# Patient Record
Sex: Male | Born: 1970 | Hispanic: No | Marital: Single | Smoking: Never smoker
Health system: Southern US, Community
[De-identification: ages and names within clinical notes are randomized; demographics above are authoritative.]

---

## 2006-04-12 ENCOUNTER — Ambulatory Visit (HOSPITAL_BASED_OUTPATIENT_CLINIC_OR_DEPARTMENT_OTHER): Admission: RE | Admit: 2006-04-12 | Discharge: 2006-04-12 | Payer: Self-pay | Admitting: Specialist

## 2017-12-30 ENCOUNTER — Ambulatory Visit: Payer: BLUE CROSS/BLUE SHIELD | Admitting: Pain Medicine

## 2018-01-05 NOTE — Patient Instructions (Addendum)
____________________________________________________________________________________________  Post-Procedure Discharge Instructions  Instructions:  Apply ice: Fill a plastic sandwich bag with crushed ice. Cover it with a small towel and apply to injection site. Apply for 15 minutes then remove x 15 minutes. Repeat sequence on day of procedure, until you go to bed. The purpose is to minimize swelling and discomfort after procedure.  Apply heat: Apply heat to procedure site starting the day following the procedure. The purpose is to treat any soreness and discomfort from the procedure.  Food intake: Start with clear liquids (like water) and advance to regular food, as tolerated.   Physical activities: Keep activities to a minimum for the first 8 hours after the procedure.   Driving: If you have received any sedation, you are not allowed to drive for 24 hours after your procedure.  Blood thinner: Restart your blood thinner 6 hours after your procedure. (Only for those taking blood thinners)  Insulin: As soon as you can eat, you may resume your normal dosing schedule. (Only for those taking insulin)  Infection prevention: Keep procedure site clean and dry.  Post-procedure Pain Diary: Extremely important that this be done correctly and accurately. Recorded information will be used to determine the next step in treatment.  Pain evaluated is that of treated area only. Do not include pain from an untreated area.  Complete every hour, on the hour, for the initial 8 hours. Set an alarm to help you do this part accurately.  Do not go to sleep and have it completed later. It will not be accurate.  Follow-up appointment: Keep your follow-up appointment after the procedure. Usually 2 weeks for most procedures. (6 weeks in the case of radiofrequency.) Bring you pain diary.   Expect:  From numbing medicine (AKA: Local Anesthetics): Numbness or decrease in pain.  Onset: Full effect within 15  minutes of injected.  Duration: It will depend on the type of local anesthetic used. On the average, 1 to 8 hours.   From steroids: Decrease in swelling or inflammation. Once inflammation is improved, relief of the pain will follow.  Onset of benefits: Depends on the amount of swelling present. The more swelling, the longer it will take for the benefits to be seen. In some cases, up to 10 days.  Duration: Steroids will stay in the system x 2 weeks. Duration of benefits will depend on multiple posibilities including persistent irritating factors.  From procedure: Some discomfort is to be expected once the numbing medicine wears off. This should be minimal if ice and heat are applied as instructed.  Call if:  You experience numbness and weakness that gets worse with time, as opposed to wearing off.  New onset bowel or bladder incontinence. (This applies to Spinal procedures only)  Emergency Numbers:  Durning business hours (Monday - Thursday, 8:00 AM - 4:00 PM) (Friday, 9:00 AM - 12:00 Noon): [phone]  After hours: [phone] ____________________________________________________________________________________________   Epidural Steroid Injection Patient Information  Description: The epidural space surrounds the nerves as they exit the spinal cord.  In some patients, the nerves can be compressed and inflamed by a bulging disc or a tight spinal canal (spinal stenosis).  By injecting steroids into the epidural space, we can bring irritated nerves into direct contact with a potentially helpful medication.  These steroids act directly on the irritated nerves and can reduce swelling and inflammation which often leads to decreased pain.  Epidural steroids may be injected anywhere along the spine and from the neck to the  low back depending upon the location of your pain.   After numbing the skin with local anesthetic (like Novocaine), a small needle is passed into the epidural space  slowly.  You may experience a sensation of pressure while this is being done.  The entire block usually last less than 10 minutes.  Conditions which may be treated by epidural steroids:   Low back and leg pain  Neck and arm pain  Spinal stenosis  Post-laminectomy syndrome  Herpes zoster (shingles) pain  Pain from compression fractures  Preparation for the injection:  1. Do not eat any solid food or dairy products within 8 hours of your appointment.  2. You may drink clear liquids up to 3 hours before appointment.  Clear liquids include water, black coffee, juice or soda.  No milk or cream please. 3. You may take your regular medication, including pain medications, with a sip of water before your appointment  Diabetics should hold regular insulin (if taken separately) and take 1/2 normal NPH dos the morning of the procedure.  Carry some sugar containing items with you to your appointment. 4. A driver must accompany you and be prepared to drive you home after your procedure.  5. Bring all your current medications with your. 6. An IV may be inserted and sedation may be given at the discretion of the physician.   7. A blood pressure cuff, EKG and other monitors will often be applied during the procedure.  Some patients may need to have extra oxygen administered for a short period. 8. You will be asked to provide medical information, including your allergies, prior to the procedure.  We must know immediately if you are taking blood thinners (like Coumadin/Warfarin)  Or if you are allergic to IV iodine contrast (dye). We must know if you could possible be pregnant.  Possible side-effects:  Bleeding from needle site  Infection (rare, may require surgery)  Nerve injury (rare)  Numbness & tingling (temporary)  Difficulty urinating (rare, temporary)  Spinal headache ( a headache worse with upright posture)  Light -headedness (temporary)  Pain at injection site (several  days)  Decreased blood pressure (temporary)  Weakness in arm/leg (temporary)  Pressure sensation in back/neck (temporary)  Call if you experience:  Fever/chills associated with headache or increased back/neck pain.  Headache worsened by an upright position.  New onset weakness or numbness of an extremity below the injection site  Hives or difficulty breathing (go to the emergency room)  Inflammation or drainage at the infection site  Severe back/neck pain  Any new symptoms which are concerning to you  Please note:  Although the local anesthetic injected can often make your back or neck feel good for several hours after the injection, the pain will likely return.  It takes 3-7 days for steroids to work in the epidural space.  You may not notice any pain relief for at least that one week.  If effective, we will often do a series of three injections spaced 3-6 weeks apart to maximally decrease your pain.  After the initial series, we generally will wait several months before considering a repeat injection of the same type.  If you have any questions, please call [phone] [facility]  General Discharge Instructions :  If you need to reach your doctor call: Monday-Friday 8:00 am - 4:00 pm at [phone] or toll free [phone].  After clinic hours [phone] to have operator reach doctor.  Bring all   of your medication bottles to all your appointments in the pain clinic.  To cancel or reschedule your appointment with Pain Management please remember to call 24 hours in advance to avoid a fee.  Refer to the educational materials which you have been given on: General Risks, I had my Procedure. Discharge Instructions, Post Sedation.  Post Procedure Instructions:  The drugs you were given will stay in your system until tomorrow, so for the next 24 hours you should not drive, make any legal decisions  or drink any alcoholic beverages.  You may eat anything you prefer, but it is better to start with liquids then soups and crackers, and gradually work up to solid foods.  Please notify your doctor immediately if you have any unusual bleeding, trouble breathing or pain that is not related to your normal pain.  Depending on the type of procedure that was done, some parts of your body may feel week and/or numb.  This usually clears up by tonight or the next day.  Walk with the use of an assistive device or accompanied by an adult for the 24 hours.  You may use ice on the affected area for the first 24 hours.  Put ice in a Ziploc bag and cover with a towel and place against area 15 minutes on 15 minutes off.  You may switch to heat after 24 hours. 

## 2018-01-05 NOTE — Progress Notes (Addendum)
Patient's Name: Patrick Gaines  MRN: [id number]  Referring Provider: Ronita Hipps, MD  DOB: 1971/03/18  PCP: No primary care provider on file.  DOS: 01/06/2018  Note by: Oswaldo Done, MD  Service setting: Ambulatory outpatient  Specialty: Interventional Pain Management  Location: [facility] (AMB) Pain Management Facility    Patient type: New patient ("FAST-TRACK" Evaluation)   Warning: This referral option does not include the extensive pharmacological evaluation required for [place] to take over the patient's medication management. The "Fast-Track" system is designed to bypass the new patient referral waiting list, as well as the normal patient evaluation process, in order to provide a patient in distress with a timely pain management intervention. Because the system was not designed to unfairly get a patient into our pain practice ahead of those already waiting, certain restrictions apply. By requesting a "Fast-Track" consult, the referring physician has opted to continue managing the patient's medications in order to get interventional urgent care.  Primary Reason for Visit: Interventional Pain Management Treatment. CC: Back Pain (lower)  Procedure:          Anesthesia, Analgesia, Anxiolysis:  Type: Diagnostic Inter-Laminar Epidural Steroid Injection #1  Region: Lumbar Level: L3-4 Level. Laterality: Right-Sided Paramedial  Type: Local Anesthesia Indication(s): Analgesia         Route: Infiltration ([place]/IM) IV Access: Declined Sedation: Declined  Local Anesthetic: Lidocaine 1-2%   Indications: 1. DDD (degenerative disc disease), lumbar   2. Chronic low back pain (Primary Area of Pain) (Bilateral) (R>L)   3. Chronic hip pain (Right)   4. Lumbar facet syndrome   5. Spondylosis without myelopathy or radiculopathy, lumbosacral region   6. Chronic pain syndrome    Pain Score: Pre-procedure: 3 /10 Post-procedure: 3 /10  HPI  Patrick Gaines is a 47 y.o. year old, male patient, who comes today  for a  "Fast-Track" new patient evaluation, as requested by Ronita Hipps, MD. The patient has been made aware that this type of referral option is reserved for the Interventional Pain Management portion of our practice and completely excludes the option of medication management. His primarily concern today is the Back Pain (lower)  Pain Assessment: Location: Lower Back Radiating: Denies Onset: More than a month ago Duration: Chronic pain Quality: Stabbing, Nagging, Constant Severity: 3 /10 (subjective, self-reported pain score)  Note: Reported level is inconsistent with clinical observations. Clinically the patient looks like a 2/10 A 2/10 is viewed as "Mild to Moderate" and described as noticeable and distracting. Impossible to hide from other people. More frequent flare-ups. Still possible to adapt and function close to normal. It can be very annoying and may have occasional stronger flare-ups. With discipline, patients may get used to it and adapt. Information on the proper use of the pain scale provided to the patient today. When using our objective Pain Scale, levels between 6 and 10/10 are said to belong in an emergency room, as it progressively worsens from a 6/10, described as severely limiting, requiring emergency care not usually available at an outpatient pain management facility. At a 6/10 level, communication becomes difficult and requires great effort. Assistance to reach the emergency department may be required. Facial flushing and profuse sweating along with potentially dangerous increases in heart rate and blood pressure will be evident. Effect on ADL: limits daily activities Timing: Constant Modifying factors: motrin, lay down and rest, repositioning BP: 97/60  HR: 89  Onset and Duration: Gradual and Present longer than 3 months Cause of pain: Unknown Severity: Getting better, NAS-11 at  its worse: 9/10, NAS-11 at its best: 2/10, NAS-11 now: 8/10 and NAS-11 on the average:  5/10 Timing: Afternoon and after working Aggravating Factors: Lifiting, Squatting and Stooping  Alleviating Factors: Cold packs, Lying down, Medications and Resting Associated Problems: Fatigue, Weakness and Pain that does not allow patient to sleep Quality of Pain: Nagging, Sharp, Shooting and Throbbing Previous Examinations or Tests: CT scan and MRI scan Previous Treatments: Steroid treatments by mouth  The patient comes into the clinics today, referred to us for a According to the patient he has been suffering from low back pain for a number years. Just recently, he experienced a flareup of his low back pain that involve some pain going down into the right hip area. He went to the emergency room at the [facility]. Those records are not available to us at this time, but he indicates that soon after that his leg pain went away. He comes in here referred to us as a "fast track" for a lumbar epidural steroid injection under fluoroscopic guidance. He indicates having had some x-rays done, as well as CT and MRI of the lumbar spine, at the [facility].  The patient's primary pain is described to be in the lower back, bilaterally, starting in the midline and then moving laterally. The pain is worse on the right when compared to the left. He denies any surgery, interventional therapies, or any recent physical therapy.  The patient's second area of pain is that of the left knee where he indicates having sustained some trauma many years ago. He has had intra-articular knee injection with local anesthetic and steroids by the orthopedic surgeons. He also indicates having had an arthroscopy. At this point he reports that this is not his primary concern. However, he indicated that he would love to come back in the future to see if we can help him with this knee pain.  The next area of pain is described to be that of the right hip were he is currently not having any pain, but he indicates that on that  day in question, The pain felt to be excruciating. Again, he denies any surgeries, interventional therapies, or physical therapy. He also denies any x-rays in that area.  Meds   Current Outpatient Medications:  .  ibuprofen (ADVIL,MOTRIN) 800 MG tablet, Take 800 mg by mouth every 8 (eight) hours as needed., Disp: , Rfl:   Imaging Review   Complexity Note: No results found under the [facility] electronic medical record.                         ROS  Cardiovascular: No reported cardiovascular signs or symptoms such as High blood pressure, coronary artery disease, abnormal heart rate or rhythm, heart attack, blood thinner therapy or heart weakness and/or failure Pulmonary or Respiratory: No reported pulmonary signs or symptoms such as wheezing and difficulty taking a deep full breath (Asthma), difficulty blowing air out (Emphysema), coughing up mucus (Bronchitis), persistent dry cough, or temporary stoppage of breathing during sleep Neurological: No reported neurological signs or symptoms such as seizures, abnormal skin sensations, urinary and/or fecal incontinence, being born with an abnormal open spine and/or a tethered spinal cord Review of Past Neurological Studies: No results found for this or any previous visit. Psychological-Psychiatric: No reported psychological or psychiatric signs or symptoms such as difficulty sleeping, anxiety, depression, delusions or hallucinations (schizophrenial), mood swings (bipolar disorders) or suicidal ideations or attempts Gastrointestinal: Reflux or heatburn Genitourinary:  No reported renal or genitourinary signs or symptoms such as difficulty voiding or producing urine, peeing blood, non-functioning kidney, kidney stones, difficulty emptying the bladder, difficulty controlling the flow of urine, or chronic kidney disease Hematological: No reported hematological signs or symptoms such as prolonged bleeding, low or poor functioning platelets, bruising or  bleeding easily, hereditary bleeding problems, low energy levels due to low hemoglobin or being anemic Endocrine: No reported endocrine signs or symptoms such as high or low blood sugar, rapid heart rate due to high thyroid levels, obesity or weight gain due to slow thyroid or thyroid disease Rheumatologic: No reported rheumatological signs and symptoms such as fatigue, joint pain, tenderness, swelling, redness, heat, stiffness, decreased range of motion, with or without associated rash Musculoskeletal: Negative for myasthenia gravis, muscular dystrophy, multiple sclerosis or malignant hyperthermia Work History: Working full time  Allergies  Patrick Gaines is allergic to codeine.  Laboratory Chemistry   Note: No results found under the [facility] electronic medical record  [facility]  Drug: Patrick Gaines  has no drug history on file. Alcohol:  has no alcohol history on file. Tobacco:  reports that he has never smoked. He has never used smokeless tobacco. Medical:  has no past medical history on file. Family: family history includes Arthritis in his father and mother; Diabetes in his father and mother; Hypertension in his mother; Obesity in his father and mother; Stroke in his mother.  History reviewed. No pertinent surgical history. Active Ambulatory Problems    Diagnosis Date Noted  . Chronic pain syndrome 01/06/2018  . DDD (degenerative disc disease), lumbar 01/06/2018  . Chronic low back pain (Primary Area of Pain) (Bilateral) (R>L) 01/06/2018  . Spondylosis without myelopathy or radiculopathy, lumbosacral region 01/06/2018  . Lumbar facet syndrome (Bilateral) (R>L) 01/06/2018  . Chronic hip pain (Right) 01/06/2018  . Grade 1 Lumbar Retrolisthesis (4 mm) of L5 over S1 01/06/2018  . Lumbar facet hypertrophy (Bilateral) 01/06/2018  . 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 Annular tear of lumbar disc (L4-5 and L5-S1) 01/06/2018  . Protrusion of intervertebral  disc of lumbosacral region (Right: L5-S1) (Left: L4-5) (Central: L3-4) 01/06/2018  . 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 Diverticulosis of colon 01/06/2018  . 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 Fluctuating blood pressure 01/06/2018   Resolved Ambulatory Problems    Diagnosis Date Noted  . No Resolved Ambulatory Problems   No Additional Past Medical History   Constitutional Exam  General appearance: Well nourished, well developed, and well hydrated. In no apparent acute distress Vitals:   01/06/18 0842 01/06/18 1001 01/06/18 1006 01/06/18 1012  BP: 111/75 122/69 97/60   Pulse: 89     Resp:  18 15 16   Temp: 97.9 F (36.6 C)     SpO2: 97% 98% 100% 100%  Weight: 230 lb (104.3 kg)     Height: 6\' 1"  (1.854 m)      BMI Assessment: Estimated body mass index is 30.34 kg/m as calculated from the following:   Height as of this encounter: 6\' 1"  (1.854 m).   Weight as of this encounter: 230 lb (104.3 kg).  BMI interpretation table: BMI level Category Range association with higher incidence of chronic pain  <18 kg/m2 Underweight   18.5-24.9 kg/m2 Ideal body weight   25-29.9 kg/m2 Overweight Increased incidence by 20%  30-34.9 kg/m2 Obese (Class I) Increased incidence by 68%  35-39.9 kg/m2 Severe obesity (Class II) Increased incidence by 136%  >40 kg/m2  Extreme obesity (Class III) Increased incidence by 254%   Patient's current BMI Ideal Body weight  Body mass index is 30.34 kg/m. Ideal body weight: 79.9 kg (176 lb 2.4 oz) Adjusted ideal body weight: 89.7 kg (197 lb 11 oz)   BMI Readings from Last 4 Encounters:  01/06/18 30.34 kg/m   Wt Readings from Last 4 Encounters:  01/06/18 230 lb (104.3 kg)  Psych/Mental status: Alert, oriented x 3 (person, place, & time)       Eyes: PERLA Respiratory: No evidence of acute respiratory distress  Lumbar Spine Area Exam  Skin & Axial Inspection: No masses, redness, or swelling Alignment: Symmetrical Functional ROM:  Decreased ROM       Stability: No instability detected Muscle Tone/Strength: Functionally intact. No obvious neuro-muscular anomalies detected. Sensory (Neurological): Unimpaired Palpation: No palpable anomalies       Provocative Tests: Hyperextension/rotation test: deferred today       Lumbar quadrant test (Kemp's test): deferred today       Lateral bending test: deferred today       Patrick's Maneuver: deferred today                   FABER test: deferred today                   S-I anterior distraction/compression test: deferred today         S-I lateral compression test: deferred today         S-I Thigh-thrust test: deferred today         S-I Gaenslen's test: deferred today          Gait & Posture Assessment  Ambulation: Unassisted Gait: Relatively normal for age and body habitus Posture: WNL   Lower Extremity Exam    Side: Right lower extremity  Side: Left lower extremity  Stability: No instability observed          Stability: No instability observed          Skin & Extremity Inspection: Skin color, temperature, and hair growth are WNL. No peripheral edema or cyanosis. No masses, redness, swelling, asymmetry, or associated skin lesions. No contractures.  Skin & Extremity Inspection: Skin color, temperature, and hair growth are WNL. No peripheral edema or cyanosis. No masses, redness, swelling, asymmetry, or associated skin lesions. No contractures.  Functional ROM: Unrestricted ROM                  Functional ROM: Unrestricted ROM                  Muscle Tone/Strength: Able to Toe-walk & Heel-walk without problems  Muscle Tone/Strength: Able to Toe-walk & Heel-walk without problems  Sensory (Neurological): Unimpaired  Sensory (Neurological): Unimpaired  Palpation: No palpable anomalies  Palpation: No palpable anomalies   Pre-op Assessment:  Patrick Gaines is a 47 y.o. (year old), male patient, seen today for interventional treatment. He  has no past surgical history on file. Mr.  Gaines has a current medication list which includes the following prescription(s): ibuprofen. His primarily concern today is the Back Pain (lower)  Initial Vital Signs:  Pulse/HCG Rate: 89ECG Heart Rate: 84 Temp: 97.9 F (36.6 C) Resp: 18 BP: 111/75 SpO2: 97 %  BMI: Estimated body mass index is 30.34 kg/m as calculated from the following:   Height as of this encounter: 6\' 1"  (1.854 m).   Weight as of this encounter: 230 lb (104.3 kg).  Risk Assessment: Allergies: Reviewed. He is allergic  to codeine.  Allergy Precautions: None required Coagulopathies: Reviewed. None identified.  Blood-thinner therapy: None at this time Active Infection(s): Reviewed. None identified. Mr. Baumgarten is afebrile  Site Confirmation: Patrick Gaines was asked to confirm the procedure and laterality before marking the site Procedure checklist: Completed Consent: Before the procedure and under the influence of no sedative(s), amnesic(s), or anxiolytics, the patient was informed of the treatment options, risks and possible complications. To fulfill our ethical and legal obligations, as recommended by the American Medical Association's Code of Ethics, I have informed the patient of my clinical impression; the nature and purpose of the treatment or procedure; the risks, benefits, and possible complications of the intervention; the alternatives, including doing nothing; the risk(s) and benefit(s) of the alternative treatment(s) or procedure(s); and the risk(s) and benefit(s) of doing nothing. The patient was provided information about the general risks and possible complications associated with the procedure. These may include, but are not limited to: failure to achieve desired goals, infection, bleeding, organ or nerve damage, allergic reactions, paralysis, and death. In addition, the patient was informed of those risks and complications associated to Spine-related procedures, such as failure to decrease pain; infection  (i.e.: Meningitis, epidural or intraspinal abscess); bleeding (i.e.: epidural hematoma, subarachnoid hemorrhage, or any other type of intraspinal or peri-dural bleeding); organ or nerve damage (i.e.: Any type of peripheral nerve, nerve root, or spinal cord injury) with subsequent damage to sensory, motor, and/or autonomic systems, resulting in permanent pain, numbness, and/or weakness of one or several areas of the body; allergic reactions; (i.e.: anaphylactic reaction); and/or death. Furthermore, the patient was informed of those risks and complications associated with the medications. These include, but are not limited to: allergic reactions (i.e.: anaphylactic or anaphylactoid reaction(s)); adrenal axis suppression; blood sugar elevation that in diabetics may result in ketoacidosis or comma; water retention that in patients with history of congestive heart failure may result in shortness of breath, pulmonary edema, and decompensation with resultant heart failure; weight gain; swelling or edema; medication-induced neural toxicity; particulate matter embolism and blood vessel occlusion with resultant organ, and/or nervous system infarction; and/or aseptic necrosis of one or more joints. Finally, the patient was informed that Medicine is not an exact science; therefore, there is also the possibility of unforeseen or unpredictable risks and/or possible complications that may result in a catastrophic outcome. The patient indicated having understood very clearly. We have given the patient no guarantees and we have made no promises. Enough time was given to the patient to ask questions, all of which were answered to the patient's satisfaction. Patrick Gaines has indicated that he wanted to continue with the procedure. Attestation: I, the ordering provider, attest that I have discussed with the patient the benefits, risks, side-effects, alternatives, likelihood of achieving goals, and potential problems during recovery  for the procedure that I have provided informed consent. Date  Time: 01/06/2018  8:45 AM  Pre-Procedure Preparation:  Monitoring: As per clinic protocol. Respiration, ETCO2, SpO2, BP, heart rate and rhythm monitor placed and checked for adequate function Safety Precautions: Patient was assessed for positional comfort and pressure points before starting the procedure. Time-out: I initiated and conducted the "Time-out" before starting the procedure, as per protocol. The patient was asked to participate by confirming the accuracy of the "Time Out" information. Verification of the correct person, site, and procedure were performed and confirmed by me, the nursing staff, and the patient. "Time-out" conducted as per Joint Commission's Universal Protocol (UP.01.01.01). Time: 1003  Description of Procedure:  Position: Prone with head of the table was raised to facilitate breathing. Target Area: The interlaminar space, initially targeting the lower laminar border of the superior vertebral body. Approach: Paramedial approach. Area Prepped: Entire Posterior Lumbar Region Prepping solution: ChloraPrep (2% chlorhexidine gluconate and 70% isopropyl alcohol) Safety Precautions: Aspiration looking for blood return was conducted prior to all injections. At no point did we inject any substances, as a needle was being advanced. No attempts were made at seeking any paresthesias. Safe injection practices and needle disposal techniques used. Medications properly checked for expiration dates. SDV (single dose vial) medications used. Description of the Procedure: Protocol guidelines were followed. The procedure needle was introduced through the skin, ipsilateral to the reported pain, and advanced to the target area. Bone was contacted and the needle walked caudad, until the lamina was cleared. The epidural space was identified using "loss-of-resistance technique" with 2-3 ml of PF-NaCl (0.9% NSS), in a 5cc LOR glass  syringe. Vitals:   01/06/18 0842 01/06/18 1001 01/06/18 1006 01/06/18 1012  BP: 111/75 122/69 97/60   Pulse: 89     Resp:  18 15 16   Temp: 97.9 F (36.6 C)     SpO2: 97% 98% 100% 100%  Weight: 230 lb (104.3 kg)     Height: 6\' 1"  (1.854 m)       Start Time: 1003 hrs. End Time: 1009 hrs. Materials:  Needle(s) Type: Epidural needle Gauge: 17G Length: 3.5-in Medication(s): Please see orders for medications and dosing details.  Imaging Guidance (Spinal):          Type of Imaging Technique: Fluoroscopy Guidance (Spinal) Indication(s): Assistance in needle guidance and placement for procedures requiring needle placement in or near specific anatomical locations not easily accessible without such assistance. Exposure Time: Please see nurses notes. Contrast: Before injecting any contrast, we confirmed that the patient did not have an allergy to iodine, shellfish, or radiological contrast. Once satisfactory needle placement was completed at the desired level, radiological contrast was injected. Contrast injected under live fluoroscopy. No contrast complications. See chart for type and volume of contrast used. Fluoroscopic Guidance: I was personally present during the use of fluoroscopy. "Tunnel Vision Technique" used to obtain the best possible view of the target area. Parallax error corrected before commencing the procedure. "Direction-depth-direction" technique used to introduce the needle under continuous pulsed fluoroscopy. Once target was reached, antero-posterior, oblique, and lateral fluoroscopic projection used confirm needle placement in all planes. Images permanently stored in EMR.            Interpretation: I personally interpreted the imaging intraoperatively. Adequate needle placement confirmed in multiple planes. Appropriate spread of contrast into desired area was observed. No evidence of afferent or efferent intravascular uptake. No intrathecal or subarachnoid spread observed.  Permanent images saved into the patient's record.  Antibiotic Prophylaxis:   Anti-infectives (From admission, onward)   None     Indication(s): None identified  Post-operative Assessment:  Post-procedure Vital Signs:  Pulse/HCG Rate: 8973 Temp: 97.9 F (36.6 C) Resp: 16 BP: 97/60 SpO2: 100 %  EBL: None  Complications: No immediate post-treatment complications observed by team, or reported by patient.  Note: The patient tolerated the entire procedure well. A repeat set of vitals were taken after the procedure and the patient was kept under observation following institutional policy, for this type of procedure. Post-procedural neurological assessment was performed, showing return to baseline, prior to discharge. The patient was provided with post-procedure discharge instructions, including a section on how to identify potential problems.  Should any problems arise concerning this procedure, the patient was given instructions to immediately contact us, at any time, without hesitation. In any case, we plan to contact the patient by telephone for a follow-up status report regarding this interventional procedure.  Comments:  No additional relevant information.  Plan of Care   Imaging Orders     DG C-Arm 1-60 Min-No Report  Procedure Orders     Lumbar Epidural Injection  Medications ordered for procedure: Meds ordered this encounter  Medications  . iopamidol (ISOVUE-M) 41 % intrathecal injection 10 mL    Must be Myelogram-compatible. If not available, you may substitute with a water-soluble, non-ionic, hypoallergenic, myelogram-compatible radiological contrast medium.  Marland Kitchen lidocaine (XYLOCAINE) 2 % (with pres) injection 400 mg  . sodium chloride flush (NS) 0.9 % injection 2 mL  . ropivacaine (PF) 2 mg/mL (0.2%) (NAROPIN) injection 2 mL  . triamcinolone acetonide (KENALOG-40) injection 40 mg   Medications administered: We administered iopamidol, lidocaine, sodium chloride flush,  ropivacaine (PF) 2 mg/mL (0.2%), and triamcinolone acetonide.  See the medical record for exact dosing, route, and time of administration.  New Prescriptions   No medications on file   Disposition: Discharge home  Discharge Date & Time: 01/06/2018; 1020 hrs.   Physician-requested Follow-up: Return for post-procedure eval (2 wks).  Future Appointments  Date Time Provider Department Center  01/26/2018 10:15 AM Delano Metz, MD [facility] None   Primary Care Physician: No primary care provider on file. Location: [facility] Note by: Oswaldo Done, MD Date: 01/06/2018; Time: 11:14 AM  Disclaimer:  Medicine is not an Visual merchandiser. The only guarantee in medicine is that nothing is guaranteed. It is important to note that the decision to proceed with this intervention was based on the information collected from the patient. The Data and conclusions were drawn from the patient's questionnaire, the interview, and the physical examination. Because the information was provided in large part by the patient, it cannot be guaranteed that it has not been purposely or unconsciously manipulated. Every effort has been made to obtain as much relevant data as possible for this evaluation. It is important to note that the conclusions that lead to this procedure are derived in large part from the available data. Always take into account that the treatment will also be dependent on availability of resources and existing treatment guidelines, considered by other Pain Management Practitioners as being common knowledge and practice, at the time of the intervention. For Medico-Legal purposes, it is also important to point out that variation in procedural techniques and pharmacological choices are the acceptable norm. The indications, contraindications, technique, and results of the above procedure should only be interpreted and judged by a Board-Certified Interventional Pain  Specialist with extensive familiarity and expertise in the same exact procedure and technique.

## 2018-01-06 ENCOUNTER — Ambulatory Visit
Admission: RE | Admit: 2018-01-06 | Discharge: 2018-01-06 | Disposition: A | Discharge status: Home / Self Care | Payer: BLUE CROSS/BLUE SHIELD | Source: Ambulatory Visit | Attending: Pain Medicine | Admitting: Pain Medicine

## 2018-01-06 ENCOUNTER — Encounter: Payer: Self-pay | Admitting: Pain Medicine

## 2018-01-06 ENCOUNTER — Ambulatory Visit (HOSPITAL_BASED_OUTPATIENT_CLINIC_OR_DEPARTMENT_OTHER): Payer: BLUE CROSS/BLUE SHIELD | Admitting: Pain Medicine

## 2018-01-06 ENCOUNTER — Other Ambulatory Visit: Payer: Self-pay

## 2018-01-06 VITALS — BP 97/60 | HR 89 | Temp 97.9°F | Resp 16 | Ht 73.0 in | Wt 230.0 lb

## 2018-01-06 DIAGNOSIS — M545 Low back pain, unspecified: Secondary | ICD-10-CM | POA: Insufficient documentation

## 2018-01-06 DIAGNOSIS — M48061 Spinal stenosis, lumbar region without neurogenic claudication: Secondary | ICD-10-CM | POA: Diagnosis not present

## 2018-01-06 DIAGNOSIS — G8929 Other chronic pain: Secondary | ICD-10-CM

## 2018-01-06 DIAGNOSIS — M5127 Other intervertebral disc displacement, lumbosacral region: Secondary | ICD-10-CM | POA: Insufficient documentation

## 2018-01-06 DIAGNOSIS — M5126 Other intervertebral disc displacement, lumbar region: Secondary | ICD-10-CM | POA: Diagnosis not present

## 2018-01-06 DIAGNOSIS — G894 Chronic pain syndrome: Secondary | ICD-10-CM

## 2018-01-06 DIAGNOSIS — Z791 Long term (current) use of non-steroidal anti-inflammatories (NSAID): Secondary | ICD-10-CM | POA: Insufficient documentation

## 2018-01-06 DIAGNOSIS — Z8249 Family history of ischemic heart disease and other diseases of the circulatory system: Secondary | ICD-10-CM | POA: Diagnosis not present

## 2018-01-06 DIAGNOSIS — E278 Other specified disorders of adrenal gland: Secondary | ICD-10-CM | POA: Insufficient documentation

## 2018-01-06 DIAGNOSIS — I998 Other disorder of circulatory system: Secondary | ICD-10-CM | POA: Diagnosis not present

## 2018-01-06 DIAGNOSIS — M8938 Hypertrophy of bone, other site: Secondary | ICD-10-CM | POA: Insufficient documentation

## 2018-01-06 DIAGNOSIS — Z833 Family history of diabetes mellitus: Secondary | ICD-10-CM | POA: Insufficient documentation

## 2018-01-06 DIAGNOSIS — M5136 Other intervertebral disc degeneration, lumbar region: Secondary | ICD-10-CM | POA: Insufficient documentation

## 2018-01-06 DIAGNOSIS — M47897 Other spondylosis, lumbosacral region: Secondary | ICD-10-CM | POA: Insufficient documentation

## 2018-01-06 DIAGNOSIS — K5730 Diverticulosis of large intestine without perforation or abscess without bleeding: Secondary | ICD-10-CM | POA: Insufficient documentation

## 2018-01-06 DIAGNOSIS — M25551 Pain in right hip: Secondary | ICD-10-CM | POA: Insufficient documentation

## 2018-01-06 DIAGNOSIS — Z885 Allergy status to narcotic agent status: Secondary | ICD-10-CM | POA: Diagnosis not present

## 2018-01-06 DIAGNOSIS — M4310 Spondylolisthesis, site unspecified: Secondary | ICD-10-CM | POA: Insufficient documentation

## 2018-01-06 DIAGNOSIS — Z823 Family history of stroke: Secondary | ICD-10-CM | POA: Insufficient documentation

## 2018-01-06 DIAGNOSIS — G96191 Perineural cyst: Secondary | ICD-10-CM | POA: Insufficient documentation

## 2018-01-06 DIAGNOSIS — G548 Other nerve root and plexus disorders: Secondary | ICD-10-CM

## 2018-01-06 DIAGNOSIS — M47817 Spondylosis without myelopathy or radiculopathy, lumbosacral region: Secondary | ICD-10-CM | POA: Insufficient documentation

## 2018-01-06 DIAGNOSIS — M47816 Spondylosis without myelopathy or radiculopathy, lumbar region: Secondary | ICD-10-CM

## 2018-01-06 DIAGNOSIS — M4317 Spondylolisthesis, lumbosacral region: Secondary | ICD-10-CM | POA: Insufficient documentation

## 2018-01-06 MED ORDER — IOPAMIDOL (ISOVUE-M 200) INJECTION 41%
10.0000 mL | Freq: Once | INTRAMUSCULAR | Status: AC
  Administered 2018-01-06: 10 mL via EPIDURAL
  Filled 2018-01-06: qty 10

## 2018-01-06 MED ORDER — LIDOCAINE HCL 2 % IJ SOLN
20.0000 mL | Freq: Once | INTRAMUSCULAR | Status: AC
  Administered 2018-01-06: 400 mg
  Filled 2018-01-06: qty 40

## 2018-01-06 MED ORDER — SODIUM CHLORIDE 0.9% FLUSH
2.0000 mL | Freq: Once | INTRAVENOUS | Status: AC
  Administered 2018-01-06: 10 mL

## 2018-01-06 MED ORDER — ROPIVACAINE HCL 2 MG/ML IJ SOLN
2.0000 mL | Freq: Once | INTRAMUSCULAR | Status: AC
  Administered 2018-01-06: 2 mL via EPIDURAL
  Filled 2018-01-06: qty 10

## 2018-01-06 MED ORDER — TRIAMCINOLONE ACETONIDE 40 MG/ML IJ SUSP
40.0000 mg | Freq: Once | INTRAMUSCULAR | Status: AC
  Administered 2018-01-06: 40 mg
  Filled 2018-01-06: qty 1

## 2018-01-07 ENCOUNTER — Telehealth: Payer: Self-pay

## 2018-01-07 NOTE — Telephone Encounter (Signed)
Post procedure phone call.  Patient states he is doing good.  

## 2018-01-11 ENCOUNTER — Other Ambulatory Visit: Payer: Self-pay

## 2018-01-25 NOTE — Progress Notes (Signed)
Patient's Name: Patrick Gaines  MRN: [id number]  Referring Provider: No ref. provider found  DOB: 1970/08/30  PCP: Patient, No Pcp Per  DOS: 01/26/2018  Note by: Gaspar Cola, MD  Service setting: Ambulatory outpatient  Specialty: Interventional Pain Management  Location: [facility] (AMB) Pain Management Facility    Patient type: Established "Fast-Track"   Primary Reason(s) for Visit: Encounter for post-procedure evaluation of chronic illness with mild to moderate exacerbation CC: Back Pain (low)  HPI  Patrick Gaines is a 47 y.o. year old, male patient, who comes today for a post-procedure evaluation. 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 His primarily concern today is the Back Pain (low)  Pain Assessment: Location: Lower Back Radiating: denies Onset: More than a month ago Duration: Chronic pain Quality: Aching, Stabbing Severity: 2 /10 (subjective, self-reported pain score)  Note: Reported level is compatible with observation.                         When using our objective Pain Scale, levels between 6 and 10/10 are said to belong in an emergency room, as it progressively worsens from a 6/10, described as severely limiting, requiring emergency care not  usually available at an outpatient pain management facility. At a 6/10 level, communication becomes difficult and requires great effort. Assistance to reach the emergency department may be required. Facial flushing and profuse sweating along with potentially dangerous increases in heart rate and blood pressure will be evident. Timing: Intermittent Modifying factors: rest and movement, injection, medications and ice BP: 119/87  HR: 75  Patrick Gaines comes in today for post-procedure evaluation after the treatment done on 01/07/2018.  Further details on both, my assessment(s), as well as the proposed treatment plan, please see below.  Post-Procedure Assessment  01/06/2018 Procedure: Diagnostic/therapeutic right-sided L3-4 lumbar epidural steroid injection #1 under fluoroscopic guidance, no sedation ("Fast-Track") Pre-procedure pain score:  3/10 Post-procedure pain score: 3/10 No initial benefit, possible due to rapid discharge after no sedation procedure, without enough time to allow full onset of block. Influential Factors: BMI: 30.34 kg/m Intra-procedural challenges: None observed.         Assessment challenges: None detected.              Reported side-effects: None.        Post-procedural adverse reactions or complications: None reported         Sedation: No sedation used. When no sedatives are used, the analgesic levels obtained are directly associated to the effectiveness of the local anesthetics. However, when sedation is provided, the level of analgesia obtained during the initial 1 hour following the intervention, is believed to be the result of a combination of factors. These factors may include, but are not limited to: 1. The effectiveness of the local anesthetics used. 2. The effects of the analgesic(s) and/or anxiolytic(s) used. 3. The degree of discomfort experienced by  the patient at the time of the procedure. 4. The patients ability and reliability in recalling and recording the  events. 5. The presence and influence of possible secondary gains and/or psychosocial factors. Reported result: Relief experienced during the 1st hour after the procedure: 100 % (Ultra-Short Term Relief)            Interpretative annotation: Clinically appropriate result. No IV Analgesic or Anxiolytic given, therefore benefits are completely due to Local Anesthetic effects.          Effects of local anesthetic: The analgesic effects attained during this period are directly associated to the localized infiltration of local anesthetics and therefore cary significant diagnostic value as to the etiological location, or anatomical origin, of the pain. Expected duration of relief is directly dependent on the pharmacodynamics of the local anesthetic used. Long-acting (4-6 hours) anesthetics used.  Reported result: Relief during the next 4 to 6 hour after the procedure: 85 % (Short-Term Relief)            Interpretative annotation: Clinically appropriate result. Analgesia during this period is likely to be Local Anesthetic-related.          Long-term benefit: Defined as the period of time past the expected duration of local anesthetics (1 hour for short-acting and 4-6 hours for long-acting). With the possible exception of prolonged sympathetic blockade from the local anesthetics, benefits during this period are typically attributed to, or associated with, other factors such as analgesic sensory neuropraxia, antiinflammatory effects, or beneficial biochemical changes provided by agents other than the local anesthetics.  Reported result: Extended relief following procedure: 50 %(morning still hurt, eases up during day , then pain slightly increases at end of day) (Long-Term Relief)            Interpretative annotation: Clinically possible results. Good relief. No permanent benefit expected. Inflammation plays a part in the etiology to the pain.          Current benefits: Defined as reported results that  persistent at this point in time.   Analgesia: 50 %            Function: Somewhat improved ROM: Somewhat improved Interpretative annotation: Recurrence of symptoms. No permanent benefit expected. Effective diagnostic intervention.          Interpretation: Results would suggest a successful diagnostic intervention.                  Plan:  Please see "Plan of Care" for details.                Laboratory Chemistry  Inflammation Markers (CRP: Acute Phase) (ESR: Chronic Phase) No results found.  Renal Markers No results found.  Hepatic Markers No results found.  Neuropathy Markers No results found.  Hematology Parameters No results found.  CV Markers No results found.  Note: No results found under the [facility] record  Recent Imaging Results   Results for orders placed in visit on 01/06/18  DG C-Arm 1-60 Min-No Report   Narrative Fluoroscopy was utilized by the requesting physician.  No radiographic  interpretation.    Interpretation Report: Fluoroscopy was used during the procedure to assist with needle guidance. The images were interpreted intraoperatively by the requesting physician.  Meds   Current Outpatient Medications:  .  ibuprofen (ADVIL,MOTRIN) 800 MG tablet, Take 800 mg by mouth every 8 (eight) hours as needed., Disp: , Rfl:   ROS  Constitutional: Denies any fever or chills Gastrointestinal: No reported hemesis,  hematochezia, vomiting, or acute GI distress Musculoskeletal: Denies any acute onset joint swelling, redness, loss of ROM, or weakness Neurological: No reported episodes of acute onset apraxia, aphasia, dysarthria, agnosia, amnesia, paralysis, loss of coordination, or loss of consciousness  Allergies  Patrick Gaines is allergic to codeine.  PFSH  Drug: Patrick Gaines  has no drug history on file. Alcohol:  has no alcohol history on file. Tobacco:  reports that he has never smoked. He has never used smokeless tobacco. Medical:   has no past medical history on file. Surgical: Patrick Gaines  has no past surgical history on file. Family: family history includes Arthritis in his father and mother; Diabetes in his father and mother; Hypertension in his mother; Obesity in his father and mother; Stroke in his mother.  Constitutional Exam  General appearance: Well nourished, well developed, and well hydrated. In no apparent acute distress Vitals:   01/26/18 1009  BP: 119/87  Pulse: 75  Resp: 16  Temp: 98 F (36.7 C)  SpO2: 97%  Weight: 230 lb (104.3 kg)  Height: 6' 1"  (1.854 m)   BMI Assessment: Estimated body mass index is 30.34 kg/m as calculated from the following:   Height as of this encounter: 6' 1"  (1.854 m).   Weight as of this encounter: 230 lb (104.3 kg).  BMI interpretation table: BMI level Category Range association with higher incidence of chronic pain  <18 kg/m2 Underweight   18.5-24.9 kg/m2 Ideal body weight   25-29.9 kg/m2 Overweight Increased incidence by 20%  30-34.9 kg/m2 Obese (Class I) Increased incidence by 68%  35-39.9 kg/m2 Severe obesity (Class II) Increased incidence by 136%  >40 kg/m2 Extreme obesity (Class III) Increased incidence by 254%   Patient's current BMI Ideal Body weight  Body mass index is 30.34 kg/m. Ideal body weight: 79.9 kg (176 lb 2.4 oz) Adjusted ideal body weight: 89.7 kg (197 lb 11 oz)   BMI Readings from Last 4 Encounters:  01/26/18 30.34 kg/m  01/06/18 30.34 kg/m   Wt Readings from Last 4 Encounters:  01/26/18 230 lb (104.3 kg)  01/06/18 230 lb (104.3 kg)  Psych/Mental status: Alert, oriented x 3 (person, place, & time)       Eyes: PERLA Respiratory: No evidence of acute respiratory distress  Cervical Spine Area Exam  Skin & Axial Inspection: No masses, redness, edema, swelling, or associated skin lesions Alignment: Symmetrical Functional ROM: Unrestricted ROM      Stability: No instability detected Muscle Tone/Strength: Functionally intact. No  obvious neuro-muscular anomalies detected. Sensory (Neurological): Unimpaired Palpation: No palpable anomalies              Upper Extremity (UE) Exam    Side: Right upper extremity  Side: Left upper extremity  Skin & Extremity Inspection: Skin color, temperature, and hair growth are WNL. No peripheral edema or cyanosis. No masses, redness, swelling, asymmetry, or associated skin lesions. No contractures.  Skin & Extremity Inspection: Skin color, temperature, and hair growth are WNL. No peripheral edema or cyanosis. No masses, redness, swelling, asymmetry, or associated skin lesions. No contractures.  Functional ROM: Unrestricted ROM          Functional ROM: Unrestricted ROM          Muscle Tone/Strength: Functionally intact. No obvious neuro-muscular anomalies detected.  Muscle Tone/Strength: Functionally intact. No obvious neuro-muscular anomalies detected.  Sensory (Neurological): Unimpaired          Sensory (Neurological): Unimpaired          Palpation: No palpable  anomalies              Palpation: No palpable anomalies              Provocative Test(s):  Phalen's test: deferred Tinel's test: deferred Apley's scratch test (touch opposite shoulder):  Action 1 (Across chest): deferred Action 2 (Overhead): deferred Action 3 (LB reach): deferred   Provocative Test(s):  Phalen's test: deferred Tinel's test: deferred Apley's scratch test (touch opposite shoulder):  Action 1 (Across chest): deferred Action 2 (Overhead): deferred Action 3 (LB reach): deferred    Thoracic Spine Area Exam  Skin & Axial Inspection: No masses, redness, or swelling Alignment: Symmetrical Functional ROM: Unrestricted ROM Stability: No instability detected Muscle Tone/Strength: Functionally intact. No obvious neuro-muscular anomalies detected. Sensory (Neurological): Unimpaired Muscle strength & Tone: No palpable anomalies  Lumbar Spine Area Exam  Skin & Axial Inspection: No masses, redness, or  swelling Alignment: Symmetrical Functional ROM: Unrestricted ROM       Stability: No instability detected Muscle Tone/Strength: Functionally intact. No obvious neuro-muscular anomalies detected. Sensory (Neurological): Unimpaired Palpation: No palpable anomalies       Provocative Tests: Hyperextension/rotation test: deferred today       Lumbar quadrant test (Kemp's test): deferred today       Lateral bending test: deferred today       Patrick's Maneuver: deferred today                   FABER test: deferred today                   S-I anterior distraction/compression test: deferred today         S-I lateral compression test: deferred today         S-I Thigh-thrust test: deferred today         S-I Gaenslen's test: deferred today          Gait & Posture Assessment  Ambulation: Unassisted Gait: Relatively normal for age and body habitus Posture: WNL   Lower Extremity Exam    Side: Right lower extremity  Side: Left lower extremity  Stability: No instability observed          Stability: No instability observed          Skin & Extremity Inspection: Skin color, temperature, and hair growth are WNL. No peripheral edema or cyanosis. No masses, redness, swelling, asymmetry, or associated skin lesions. No contractures.  Skin & Extremity Inspection: Skin color, temperature, and hair growth are WNL. No peripheral edema or cyanosis. No masses, redness, swelling, asymmetry, or associated skin lesions. No contractures.  Functional ROM: Unrestricted ROM                  Functional ROM: Unrestricted ROM                  Muscle Tone/Strength: Functionally intact. No obvious neuro-muscular anomalies detected.  Muscle Tone/Strength: Functionally intact. No obvious neuro-muscular anomalies detected.  Sensory (Neurological): Unimpaired  Sensory (Neurological): Unimpaired  Palpation: No palpable anomalies  Palpation: No palpable anomalies   Assessment  Primary Diagnosis & Pertinent Problem List: The  primary encounter diagnosis was Chronic low back pain (Primary Area of Pain) (Bilateral) (R>L). 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  Status Diagnosis  Controlled Controlled Controlled 1. Chronic low back pain (Primary Area of Pain) (Bilateral) (R>L)   2. Grade 1 Lumbar Retrolisthesis (4 mm) of L5 over S1   3. Lumbar facet hypertrophy (Bilateral)   4. Lumbar facet syndrome (Bilateral) (R>L)   5. Spondylosis without myelopathy or radiculopathy, lumbosacral region   6. Pharmacologic therapy   7. Problems influencing health status   8. Disorder of skeletal system   9. Fluctuating blood pressure   10. 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 Plan of Care  Pharmacotherapy (Medications Ordered): No orders of the defined types were placed in this encounter.  Medications administered today: Patrick Gaines had no medications administered during this visit.   Procedure Orders     LUMBAR FACET(MEDIAL BRANCH NERVE BLOCK) MBNB  Lab Orders     Comp. Metabolic Panel (12)     Magnesium     Vitamin B12     Sedimentation rate     25-Hydroxyvitamin D Lcms D2+D3     C-reactive protein     Compliance Drug Analysis, Ur     Catecholamine+VMA, 24-Hr Urine     VMA, random urine  Imaging Orders     DG Lumbar Spine Complete W/Bend Referral Orders  No referral(s) requested today   Interventional management options: Planned, scheduled, and/or pending:   Diagnostic bilateral lumbar facet block #1 under fluoroscopic guidance and IV sedation   Considering:   Diagnostic  bilateral lumbar facet block #1  Possible bilateral lumbar facet RFA  Diagnostic/therapeutic right-sided L3-4 LESI #2  Diagnostic bilateral L4-5 transforaminal ESI #1  Diagnostic bilateral L5-S1 transforaminal ESI #1  Diagnostic right-sided L3-4 transforaminal ESI #1  Diagnostic left-sided L4 selective nerve root block vs transforaminal ESI #1    Palliative PRN treatment(s):   None at this time   Provider-requested follow-up: Return for Procedure (w/ sedation): (B) L-FCT BLK #1.  No future appointments. Primary Care Physician: Patient, No Pcp Per Location: [facility] Note by: Gaspar Cola, MD Date: 01/26/2018; Time: 11:37 AM

## 2018-01-26 ENCOUNTER — Other Ambulatory Visit: Payer: Self-pay

## 2018-01-26 ENCOUNTER — Ambulatory Visit: Payer: BLUE CROSS/BLUE SHIELD | Admitting: Pain Medicine

## 2018-01-26 ENCOUNTER — Ambulatory Visit
Admission: RE | Admit: 2018-01-26 | Discharge: 2018-01-26 | Disposition: A | Discharge status: Home / Self Care | Payer: BLUE CROSS/BLUE SHIELD | Source: Ambulatory Visit | Attending: Pain Medicine | Admitting: Pain Medicine

## 2018-01-26 ENCOUNTER — Encounter: Payer: Self-pay | Admitting: Pain Medicine

## 2018-01-26 VITALS — BP 119/87 | HR 75 | Temp 98.0°F | Resp 16 | Ht 73.0 in | Wt 230.0 lb

## 2018-01-26 DIAGNOSIS — G8929 Other chronic pain: Secondary | ICD-10-CM

## 2018-01-26 DIAGNOSIS — I998 Other disorder of circulatory system: Secondary | ICD-10-CM

## 2018-01-26 DIAGNOSIS — M4310 Spondylolisthesis, site unspecified: Secondary | ICD-10-CM

## 2018-01-26 DIAGNOSIS — M47817 Spondylosis without myelopathy or radiculopathy, lumbosacral region: Secondary | ICD-10-CM

## 2018-01-26 DIAGNOSIS — M545 Low back pain, unspecified: Secondary | ICD-10-CM

## 2018-01-26 DIAGNOSIS — M47816 Spondylosis without myelopathy or radiculopathy, lumbar region: Secondary | ICD-10-CM | POA: Diagnosis not present

## 2018-01-26 DIAGNOSIS — E279 Disorder of adrenal gland, unspecified: Secondary | ICD-10-CM

## 2018-01-26 DIAGNOSIS — M899 Disorder of bone, unspecified: Secondary | ICD-10-CM | POA: Insufficient documentation

## 2018-01-26 DIAGNOSIS — E278 Other specified disorders of adrenal gland: Secondary | ICD-10-CM

## 2018-01-26 DIAGNOSIS — Z789 Other specified health status: Secondary | ICD-10-CM | POA: Insufficient documentation

## 2018-01-26 DIAGNOSIS — Z79899 Other long term (current) drug therapy: Secondary | ICD-10-CM

## 2018-01-26 NOTE — Patient Instructions (Signed)
____________________________________________________________________________________________  Preparing for Procedure with Sedation  Instructions: . Oral Intake: Do not eat or drink anything for at least 8 hours prior to your procedure. . Transportation: Public transportation is not allowed. Bring an adult driver. The driver must be physically present in our waiting room before any procedure can be started. . Physical Assistance: Bring an adult physically capable of assisting you, in the event you need help. This adult should keep you company at home for at least 6 hours after the procedure. . Blood Pressure Medicine: Take your blood pressure medicine with a sip of water the morning of the procedure. . Blood thinners: Notify our staff if you are taking any blood thinners. Depending on which one you take, there will be specific instructions on how and when to stop it. . Diabetics on insulin: Notify the staff so that you can be scheduled 1st case in the morning. If your diabetes requires high dose insulin, take only  of your normal insulin dose the morning of the procedure and notify the staff that you have done so. . Preventing infections: Shower with an antibacterial soap the morning of your procedure. . Build-up your immune system: Take 1000 mg of Vitamin C with every meal (3 times a day) the day prior to your procedure. . Antibiotics: Inform the staff if you have a condition or reason that requires you to take antibiotics before dental procedures. . Pregnancy: If you are pregnant, call and cancel the procedure. . Sickness: If you have a cold, fever, or any active infections, call and cancel the procedure. . Arrival: You must be in the facility at least 30 minutes prior to your scheduled procedure. . Children: Do not bring children with you. . Dress appropriately: Bring dark clothing that you would not mind if they get stained. . Valuables: Do not bring any jewelry or valuables.  Procedure  appointments are reserved for interventional treatments only. . No Prescription Refills. . No medication changes will be discussed during procedure appointments. . No disability issues will be discussed.  Reasons to call and reschedule or cancel your procedure: (Following these recommendations will minimize the risk of a serious complication.) . Surgeries: Avoid having procedures within 2 weeks of any surgery. (Avoid for 2 weeks before or after any surgery). . Flu Shots: Avoid having procedures within 2 weeks of a flu shots or . (Avoid for 2 weeks before or after immunizations). . Barium: Avoid having a procedure within 7-10 days after having had a radiological study involving the use of radiological contrast. (Myelograms, Barium swallow or enema study). . Heart attacks: Avoid any elective procedures or surgeries for the initial 6 months after a "Myocardial Infarction" (Heart Attack). . Blood thinners: It is imperative that you stop these medications before procedures. Let us know if you if you take any blood thinner.  . Infection: Avoid procedures during or within two weeks of an infection (including chest colds or gastrointestinal problems). Symptoms associated with infections include: Localized redness, fever, chills, night sweats or profuse sweating, burning sensation when voiding, cough, congestion, stuffiness, runny nose, sore throat, diarrhea, nausea, vomiting, cold or Flu symptoms, recent or current infections. It is specially important if the infection is over the area that we intend to treat. . Heart and lung problems: Symptoms that may suggest an active cardiopulmonary problem include: cough, chest pain, breathing difficulties or shortness of breath, dizziness, ankle swelling, uncontrolled high or unusually low blood pressure, and/or palpitations. If you are experiencing any of these symptoms, cancel   your procedure and contact your primary care physician for an evaluation.  Remember:   Regular Business hours are:  Monday to Thursday 8:00 AM to 4:00 PM  Provider's Schedule: Kjersti Dittmer, MD:  Procedure days: Tuesday and Thursday 7:30 AM to 4:00 PM  Bilal Lateef, MD:  Procedure days: Monday and Wednesday 7:30 AM to 4:00 PM ____________________________________________________________________________________________    

## 2018-01-26 NOTE — Progress Notes (Signed)
Safety precautions to be maintained throughout the outpatient stay will include: orient to surroundings, keep bed in low position, maintain call bell within reach at all times, provide assistance with transfer out of bed and ambulation.  

## 2018-01-31 LAB — 25-HYDROXYVITAMIN D LCMS D2+D3: 25-HYDROXY, VITAMIN D: 27 ng/mL — AB

## 2018-01-31 LAB — COMP. METABOLIC PANEL (12)
A/G RATIO: 1.7 (ref 1.2–2.2)
AST: 18 IU/L (ref 0–40)
Albumin: 4.6 g/dL (ref 3.5–5.5)
Alkaline Phosphatase: 56 IU/L (ref 39–117)
BUN/Creatinine Ratio: 11 (ref 9–20)
BUN: 13 mg/dL (ref 6–24)
Bilirubin Total: 0.4 mg/dL (ref 0.0–1.2)
CHLORIDE: 101 mmol/L (ref 96–106)
CREATININE: 1.14 mg/dL (ref 0.76–1.27)
Calcium: 10.1 mg/dL (ref 8.7–10.2)
GFR calc Af Amer: 90 mL/min/{1.73_m2} (ref >59)
GFR calc non Af Amer: 78 mL/min/{1.73_m2} (ref >59)
Globulin, Total: 2.7 g/dL (ref 1.5–4.5)
Glucose: 84 mg/dL (ref 65–99)
Potassium: 4.9 mmol/L (ref 3.5–5.2)
Sodium: 143 mmol/L (ref 134–144)
Total Protein: 7.3 g/dL (ref 6.0–8.5)

## 2018-01-31 LAB — VMA, RANDOM URINE
CREATININE, RANDOM U: 96.4 mg/dL
VMA, RANDOM URINE: 2.5 mg/L
VMA/CRT, RANDOM U: 2.6 mg/g{creat} (ref 0.0–6.0)

## 2018-01-31 LAB — VITAMIN B12: Vitamin B-12: 564 pg/mL (ref 232–1245)

## 2018-01-31 LAB — MAGNESIUM: MAGNESIUM: 2 mg/dL (ref 1.6–2.3)

## 2018-01-31 LAB — SEDIMENTATION RATE: SED RATE: 4 mm/h (ref 0–15)

## 2018-01-31 LAB — C-REACTIVE PROTEIN: CRP: 1 mg/L (ref 0–10)

## 2018-01-31 LAB — 25-HYDROXY VITAMIN D LCMS D2+D3: 25-Hydroxy, Vitamin D-3: 27 ng/mL

## 2018-01-31 LAB — COMPLIANCE DRUG ANALYSIS, UR

## 2018-02-10 LAB — CATECHOLAMINE+VMA, 24-HR URINE
DOPAMINE 24H UR: 237 ug/(24.h) (ref 0–510)
Dopamine, Rand Ur: 113 ug/L
EPINEPHRINE 24H UR: 2 ug/(24.h) (ref 0–20)
EPINEPHRINE RAND UR: 1 ug/L
NOREPINEPH RAND UR: 17 ug/L
Norepinephrine, 24H Ur: 36 ug/24 hr (ref 0–135)
VMA 24H UR ADULT: 3.8 mg/(24.h) (ref 0.0–7.5)
VMA, URINE: 1.8 mg/L

## 2019-07-20 IMAGING — CR DG LUMBAR SPINE COMPLETE W/ BEND
1 series · 7 of 7 positions shown · non-contrast
Comparison: MRI 10/28/2017.  CT 10/19/2017.

CLINICAL DATA: Chronic back pain.

EXAM:
LUMBAR SPINE - COMPLETE WITH BENDING VIEWS

[Series 1: dg lumbar spine complete w/bend 6+v · 0.14mm/px · 7 of 7 slices shown]
[im 1/7]
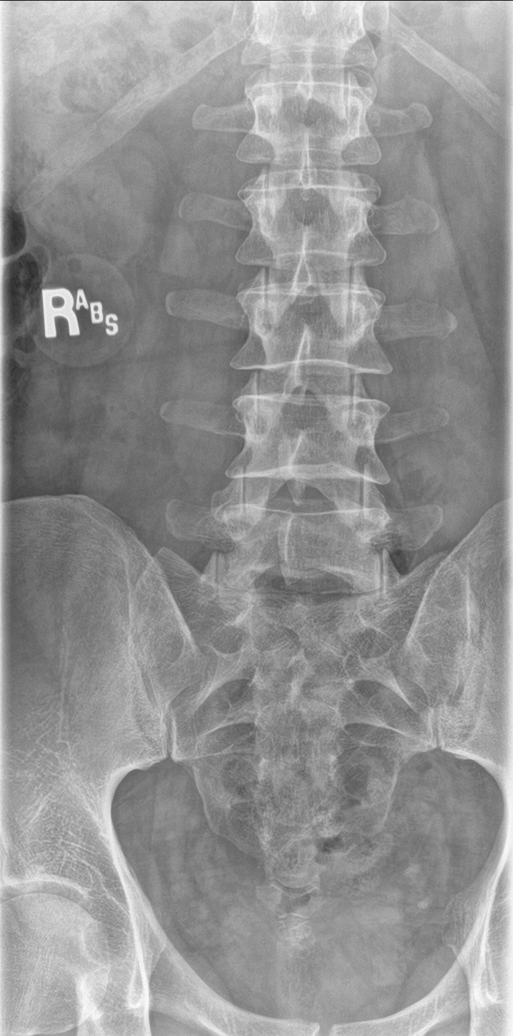
[im 2/7]
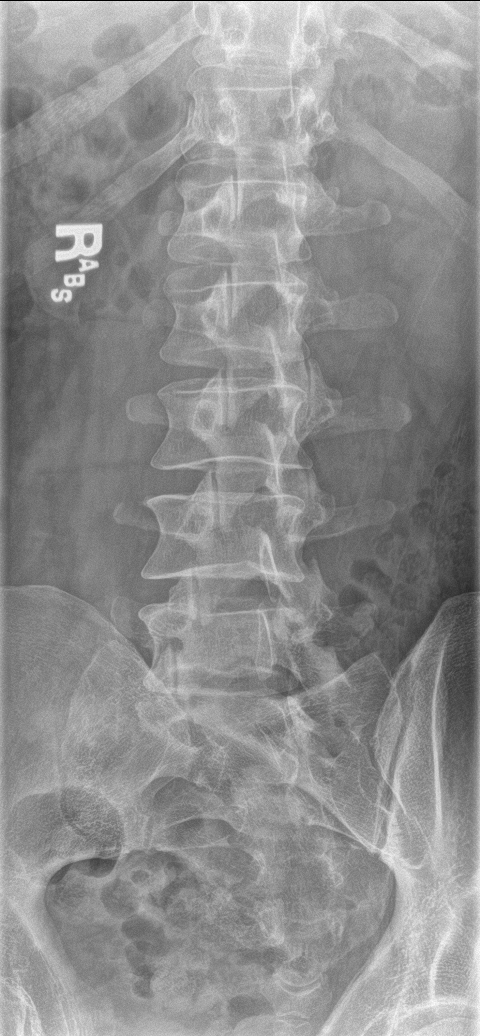
[im 3/7]
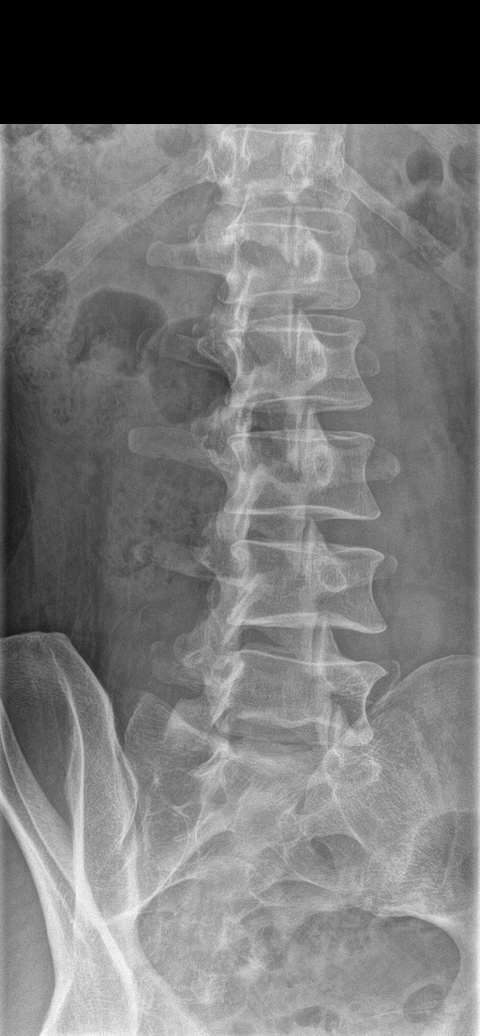
[im 4/7]
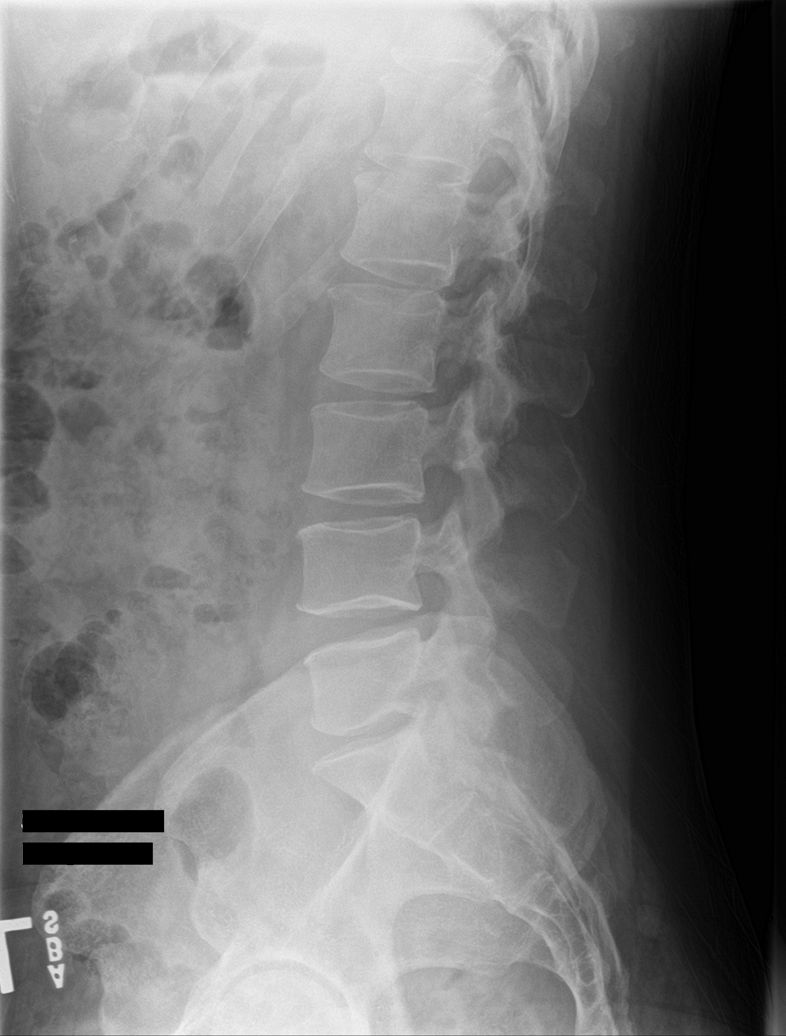
[im 5/7]
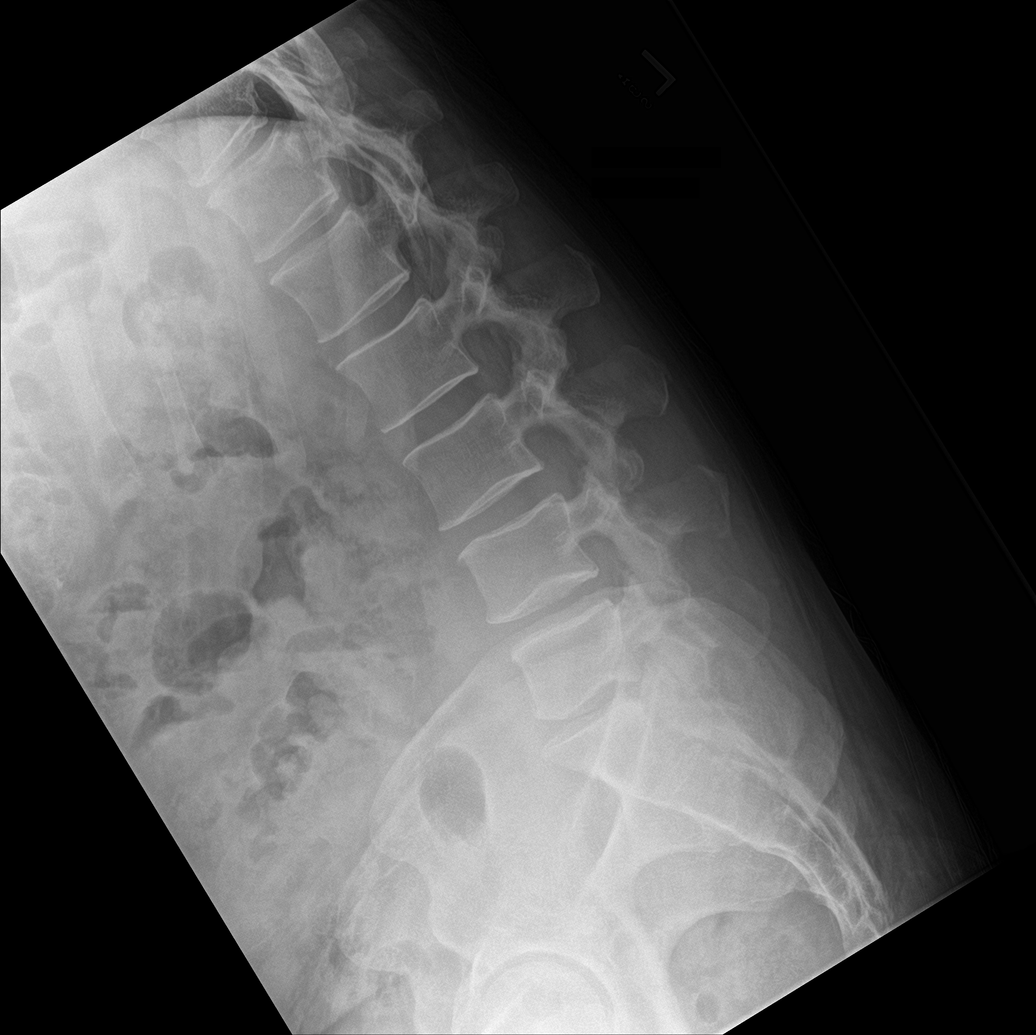
[im 6/7]
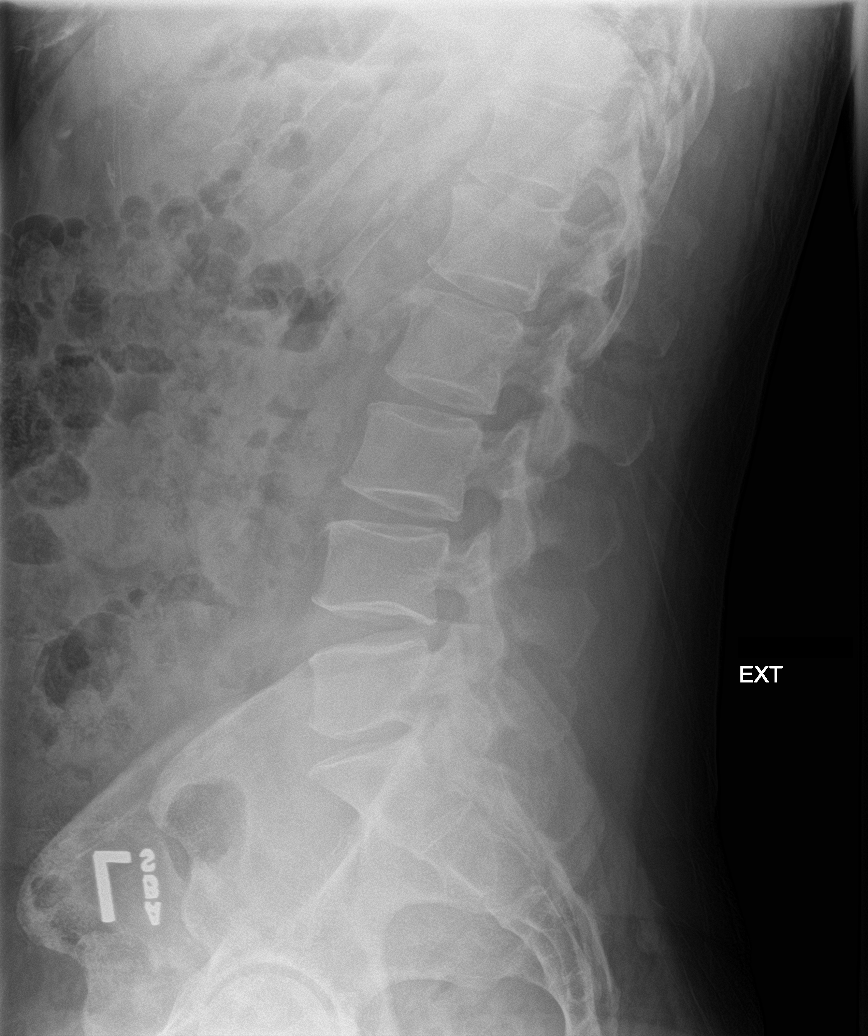
[im 7/7]
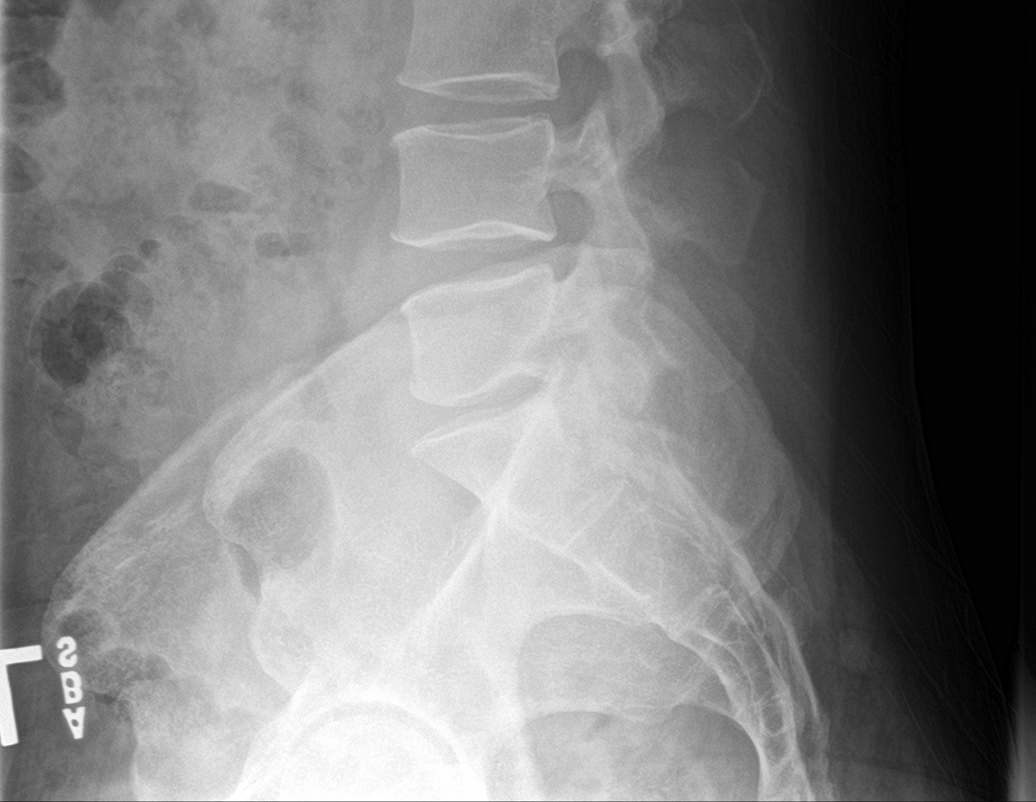

[7 of 7 positions shown; findings below may reference images not displayed]

FINDINGS: No acute bony abnormality identified. No evidence of fracture. No
evidence of flexion or extension instability. Mild degenerative
change. Pelvic calcifications consistent phleboliths.
IMPRESSION: No acute or focal bony abnormality identified. No flexion or
extension instability noted. Mild diffuse degenerative change.
